# Patient Record
Sex: Female | Born: 1955 | Race: Black or African American | Hispanic: No | Marital: Single | Smoking: Current every day smoker
Health system: Southern US, Community
[De-identification: ages and names within clinical notes are randomized; demographics above are authoritative.]

## PROBLEM LIST (undated history)

## (undated) DIAGNOSIS — G510 Bell's palsy: Secondary | ICD-10-CM

## (undated) HISTORY — DX: Bell's palsy: G51.0

---

## 2000-10-09 ENCOUNTER — Emergency Department (HOSPITAL_COMMUNITY): Admission: EM | Admit: 2000-10-09 | Discharge: 2000-10-09 | Payer: Self-pay | Admitting: Emergency Medicine

## 2000-10-09 ENCOUNTER — Encounter: Payer: Self-pay | Admitting: Emergency Medicine

## 2017-06-15 ENCOUNTER — Emergency Department (HOSPITAL_COMMUNITY)
Admission: EM | Admit: 2017-06-15 | Discharge: 2017-06-15 | Disposition: A | Discharge status: Home / Self Care | Payer: Self-pay | Attending: Emergency Medicine | Admitting: Emergency Medicine

## 2017-06-15 ENCOUNTER — Encounter (HOSPITAL_COMMUNITY): Payer: Self-pay

## 2017-06-15 DIAGNOSIS — M25561 Pain in right knee: Secondary | ICD-10-CM | POA: Insufficient documentation

## 2017-06-15 DIAGNOSIS — Y998 Other external cause status: Secondary | ICD-10-CM | POA: Insufficient documentation

## 2017-06-15 DIAGNOSIS — M545 Low back pain: Secondary | ICD-10-CM | POA: Insufficient documentation

## 2017-06-15 DIAGNOSIS — Z5321 Procedure and treatment not carried out due to patient leaving prior to being seen by health care provider: Secondary | ICD-10-CM | POA: Insufficient documentation

## 2017-06-15 DIAGNOSIS — Y939 Activity, unspecified: Secondary | ICD-10-CM | POA: Insufficient documentation

## 2017-06-15 DIAGNOSIS — Y92410 Unspecified street and highway as the place of occurrence of the external cause: Secondary | ICD-10-CM | POA: Insufficient documentation

## 2017-06-15 DIAGNOSIS — M25562 Pain in left knee: Secondary | ICD-10-CM | POA: Insufficient documentation

## 2017-06-15 NOTE — ED Notes (Signed)
No answer x3
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
## 2017-06-19 NOTE — ED Provider Notes (Signed)
I did not see or evaluate the patient. Left without being seen after triage   Azalia Bilis, MD 06/19/17 [phone]

## 2020-08-26 ENCOUNTER — Emergency Department (HOSPITAL_COMMUNITY)
Admission: EM | Admit: 2020-08-26 | Discharge: 2020-08-27 | Discharge status: Against Medical Advice | Payer: Self-pay | Attending: Student | Admitting: Student

## 2020-08-26 ENCOUNTER — Encounter (HOSPITAL_COMMUNITY): Payer: Self-pay

## 2020-08-26 ENCOUNTER — Emergency Department (HOSPITAL_COMMUNITY): Payer: Self-pay

## 2020-08-26 DIAGNOSIS — R791 Abnormal coagulation profile: Secondary | ICD-10-CM | POA: Insufficient documentation

## 2020-08-26 DIAGNOSIS — Y900 Blood alcohol level of less than 20 mg/100 ml: Secondary | ICD-10-CM | POA: Insufficient documentation

## 2020-08-26 DIAGNOSIS — R29810 Facial weakness: Secondary | ICD-10-CM | POA: Insufficient documentation

## 2020-08-26 LAB — PROTIME-INR
INR: 1 (ref 0.8–1.2)
Prothrombin Time: 12.7 seconds (ref 11.4–15.2)

## 2020-08-26 LAB — DIFFERENTIAL
Abs Immature Granulocytes: 0.02 10*3/uL (ref 0.00–0.07)
Basophils Absolute: 0 10*3/uL (ref 0.0–0.1)
Basophils Relative: 1 %
Eosinophils Absolute: 0.1 10*3/uL (ref 0.0–0.5)
Eosinophils Relative: 2 %
Immature Granulocytes: 0 %
Lymphocytes Relative: 35 %
Lymphs Abs: 1.9 10*3/uL (ref 0.7–4.0)
Monocytes Absolute: 0.5 10*3/uL (ref 0.1–1.0)
Monocytes Relative: 10 %
Neutro Abs: 3 10*3/uL (ref 1.7–7.7)
Neutrophils Relative %: 52 %

## 2020-08-26 LAB — APTT: aPTT: 31 seconds (ref 24–36)

## 2020-08-26 LAB — I-STAT CHEM 8, ED
BUN: 13 mg/dL (ref 8–23)
Calcium, Ion: 1.05 mmol/L — ABNORMAL LOW (ref 1.15–1.40)
Chloride: 102 mmol/L (ref 98–111)
Creatinine, Ser: 0.6 mg/dL (ref 0.44–1.00)
Glucose, Bld: 139 mg/dL — ABNORMAL HIGH (ref 70–99)
HCT: 39 % (ref 36.0–46.0)
Hemoglobin: 13.3 g/dL (ref 12.0–15.0)
Potassium: 3.8 mmol/L (ref 3.5–5.1)
Sodium: 137 mmol/L (ref 135–145)
TCO2: 29 mmol/L (ref 22–32)

## 2020-08-26 LAB — COMPREHENSIVE METABOLIC PANEL
ALT: 24 U/L (ref 0–44)
AST: 24 U/L (ref 15–41)
Albumin: 3.7 g/dL (ref 3.5–5.0)
Alkaline Phosphatase: 56 U/L (ref 38–126)
Anion gap: 9 (ref 5–15)
BUN: 11 mg/dL (ref 8–23)
CO2: 28 mmol/L (ref 22–32)
Calcium: 9.7 mg/dL (ref 8.9–10.3)
Chloride: 100 mmol/L (ref 98–111)
Creatinine, Ser: 0.72 mg/dL (ref 0.44–1.00)
GFR, Estimated: >60 mL/min (ref >60)
Glucose, Bld: 137 mg/dL — ABNORMAL HIGH (ref 70–99)
Potassium: 3.7 mmol/L (ref 3.5–5.1)
Sodium: 137 mmol/L (ref 135–145)
Total Bilirubin: 0.6 mg/dL (ref 0.3–1.2)
Total Protein: 7.4 g/dL (ref 6.5–8.1)

## 2020-08-26 LAB — CBC
HCT: 41.4 % (ref 36.0–46.0)
Hemoglobin: 12.6 g/dL (ref 12.0–15.0)
MCH: 27.8 pg (ref 26.0–34.0)
MCHC: 30.4 g/dL (ref 30.0–36.0)
MCV: 91.4 fL (ref 80.0–100.0)
Platelets: 346 10*3/uL (ref 150–400)
RBC: 4.53 MIL/uL (ref 3.87–5.11)
RDW: 14.5 % (ref 11.5–15.5)
WBC: 5.6 10*3/uL (ref 4.0–10.5)
nRBC: 0 % (ref 0.0–0.2)

## 2020-08-26 LAB — ETHANOL: Alcohol, Ethyl (B): <10 mg/dL (ref <10)

## 2020-08-26 NOTE — ED Provider Notes (Signed)
Emergency Medicine Provider Triage Evaluation Note  Janet Miller , a 65 y.o. female  was evaluated in triage.  Pt complains of left-sided facial numbness.  Patient last known normal was last night.  No weakness of the body but there is some left-sided numbness.  She has no medical problems, takes no medicine.  No history of blood clots or previous stroke..  Review of Systems  Positive: Unable to close left eyelid, left facial numbness Negative: Extremity weakness   Physical Exam  BP (!) 175/107   Pulse 95   Temp 98.9 F (37.2 C) (Oral)   Resp 18   SpO2 100%  Gen:   Awake, tearful and anxious  Resp:  Normal effort  MSK:   Moves extremities without difficulty  Other:  Unable to raise left eye brow.  Grip strength in tact bilaterally.  Negative pronator drift.  Able to raise both legs.    Medical Decision Making  Medically screening exam initiated at 5:39 PM.  Appropriate orders placed.  Janet Miller was informed that the remainder of the evaluation will be completed by another provider, this initial triage assessment does not replace that evaluation, and the importance of remaining in the ED until their evaluation is complete.  Suspect bells palsy, outside of stroke window    Theron Arista, Cordelia Poche 08/26/20 1741    Dione Booze, MD 08/29/20 2237

## 2020-08-26 NOTE — ED Notes (Signed)
Pt left due to not being seen quick enough 

## 2020-08-26 NOTE — ED Triage Notes (Signed)
Pt arrives POV for eval of L sided facial droop and paralysis. Reports she was normal going to bed last night and awoke this AM w/ the facial weakness. L eyebrow/lid does not move on command. Forehead involvement as well

## 2020-12-15 ENCOUNTER — Encounter: Payer: Self-pay | Admitting: Neurology

## 2021-01-24 ENCOUNTER — Other Ambulatory Visit: Payer: Self-pay | Admitting: Family

## 2021-01-24 ENCOUNTER — Other Ambulatory Visit: Payer: Self-pay

## 2021-01-24 DIAGNOSIS — F17210 Nicotine dependence, cigarettes, uncomplicated: Secondary | ICD-10-CM

## 2021-03-14 NOTE — Progress Notes (Signed)
NEUROLOGY CONSULTATION NOTE  Janet Miller MRN: [id number] DOB: 1955/02/15  Referring provider: Hillery Aldo, NP Primary care provider: Hillery Aldo, NP  Reason for consult:  Bell's palsy  Assessment/Plan:   Left sided Bell's palsy - classic presentation and without other symptoms concerning for alternative diagnosis Family history of Moyamoya disease Vertigo - very well could be peripheral vertigo.  However, given her family history, would check vertebrobasilar system Hypertension  1   MRI of brain and MRA head and neck 2   Pending results of MR and if dizziness does not continue to improve, consider vestibular rehab 3  Further recommendations pending results. 4  Follow up with PCP regarding blood pressure   Subjective:  Janet Miller is a 66 year old left-handed female who presents for Bell's palsy.  History supplemented by ED and referring provider's note.  She woke up on the morning of 08/26/2020 with left sided upper and lower facial droop.  CT head personally reviewed was unremarkable.  Bell's palsy was suspected but patient left before anything more could be done due to the long wait time.  Afterwards, she was found to have a left inner ear infection.  Treated with prednisone and antibiotics.  Overall improved but still with mild facial symmetry.  May have some twitching.    Concerned about Moyamoya.  Her mother, maternal aunt and maternal grandmother had Moyamoya.    For the past week ,reports dizziness.  It is a spinning sensation.  It is a persistent mild dizziness.  Bending over to tie her shoes aggravates it for less than a minute.  No nausea or double vision unilateral numbness or weakness.        Labs from September include sed rate 51, CRP 21.9, negative ANA, negative RF, negative Lyme antibody, TSH 1.27, D 25-OH 59, normal CBC and unremarkable CMP   PAST MEDICAL HISTORY: Past Medical History:  Diagnosis Date   Bell's palsy      PAST SURGICAL  HISTORY: No past surgical history on file.   MEDICATIONS: No current outpatient medications on file prior to visit.   No current facility-administered medications on file prior to visit.      ALLERGIES: No Known Allergies  FAMILY HISTORY: Family History  Problem Relation Age of Onset   Dementia Mother    Seizures Mother    Stroke Mother    Moyamoya disease Mother    Moyamoya disease Maternal Aunt    Moyamoya disease Maternal Grandmother     Objective:  Blood pressure (!) 150/80, pulse 77, height 5\' 5"  (1.651 m), weight 176 lb 9.6 oz (80.1 kg), SpO2 99 %. General: No acute distress.  Patient appears well-groomed.   Head:  Normocephalic/atraumatic Eyes:  fundi examined but not visualized Neck: supple, no paraspinal tenderness, full range of motion Back: No paraspinal tenderness Heart: regular rate and rhythm Lungs: Clear to auscultation bilaterally. Vascular: No carotid bruits. Neurological Exam: Mental status: alert and oriented to person, place, and time, recent and remote memory intact, fund of knowledge intact, attention and concentration intact, speech fluent and not dysarthric, language intact. Cranial nerves: CN I: not tested CN II: pupils equal, round and reactive to light, visual fields intact CN III, IV, VI:  full range of motion.  Horizontal nystagmus noted on bilateral lateral and orthophoric gaze. CN V: facial sensation intact. CN VII: mild left upper and lower facial weakness CN VIII: hearing intact CN IX, X: gag intact, uvula midline CN XI: sternocleidomastoid and trapezius muscles intact  CN XII: tongue midline Bulk & Tone: normal, no fasciculations. Motor:  muscle strength 5/5 throughout Sensation:  Pinprick, temperature and vibratory sensation intact. Deep Tendon Reflexes:  2+ throughout,  toes downgoing.   Finger to nose testing:  Without dysmetria.   Heel to shin:  Without dysmetria.   Gait:  Steady station and gait.  Unsteady tandem walk.   Romberg with mild sway.    Thank you for allowing me to take part in the care of this patient.  Shon Millet, DO  CC:  Hillery Aldo, NP  Rozetta Nunnery, FNP

## 2021-03-15 ENCOUNTER — Encounter: Payer: Self-pay | Admitting: Neurology

## 2021-03-15 ENCOUNTER — Ambulatory Visit: Payer: Medicare Other | Admitting: Neurology

## 2021-03-15 ENCOUNTER — Other Ambulatory Visit: Payer: Self-pay

## 2021-03-15 VITALS — BP 150/80 | HR 77 | Ht 65.0 in | Wt 176.6 lb

## 2021-03-15 DIAGNOSIS — G510 Bell's palsy: Secondary | ICD-10-CM

## 2021-03-15 DIAGNOSIS — R42 Dizziness and giddiness: Secondary | ICD-10-CM | POA: Diagnosis not present

## 2021-03-15 DIAGNOSIS — H5500 Unspecified nystagmus: Secondary | ICD-10-CM | POA: Diagnosis not present

## 2021-03-15 DIAGNOSIS — Z8249 Family history of ischemic heart disease and other diseases of the circulatory system: Secondary | ICD-10-CM | POA: Diagnosis not present

## 2021-03-15 DIAGNOSIS — I10 Essential (primary) hypertension: Secondary | ICD-10-CM

## 2021-03-15 NOTE — Patient Instructions (Signed)
Check MRI of brain and MRA of head and neck Further recommendations pending results If dizziness does not continue to improve, we can refer to physical therapy for vestibular rehab.

## 2021-03-16 NOTE — Progress Notes (Signed)
NO PA required per [employer].   MRI/MRA head scheduled for 03/22/21 at 9 pt to arrive at 8:30at [facility] hospital.

## 2021-03-22 ENCOUNTER — Ambulatory Visit (HOSPITAL_COMMUNITY): Admission: RE | Admit: 2021-03-22 | Payer: Medicare Other | Source: Ambulatory Visit

## 2021-03-22 ENCOUNTER — Encounter (HOSPITAL_COMMUNITY): Payer: Self-pay

## 2021-03-22 ENCOUNTER — Ambulatory Visit (HOSPITAL_COMMUNITY): Payer: Medicare Other

## 2021-09-27 ENCOUNTER — Other Ambulatory Visit: Payer: Self-pay | Admitting: Student

## 2021-09-27 DIAGNOSIS — E2839 Other primary ovarian failure: Secondary | ICD-10-CM

## 2021-10-23 ENCOUNTER — Inpatient Hospital Stay: Admission: RE | Admit: 2021-10-23 | Payer: Medicare Other | Source: Ambulatory Visit

## 2021-11-07 ENCOUNTER — Other Ambulatory Visit: Payer: Medicare Other

## 2022-02-26 NOTE — Progress Notes (Signed)
Office Visit Note  Patient: Janet Miller             Date of Birth: [birth year]           MRN: [id number]             PCP: Raymon Mutton., FNP Referring: Raymon Mutton., FNP Visit Date: 03/12/2022 Occupation: @GUAROCC @  Subjective:  Positive ANA   History of Present Illness: Janet Miller is a 67 y.o. female in consultation per request of her PCP for the evaluation of positive ANA.  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  According the patient in 2022 she developed left-sided Bell's palsy.  She states due to lack of insurance she could not see her PCP until 2023.  She states at that time she had left ear infection which was treated with antibiotics and prednisone.  She had no recurrence of ear infection.  She gives history of dry eyes for the last couple of years.  She has been using TheraTears .  She denies any history of oral ulcers, nasal ulcers, malar rash, photosensitivity, Raynaud's phenomenon, lymphadenopathy or inflammatory arthritis.  She denies any history of joint pain.  She states she is ambidextrous.  She notices intermittent popping in her right elbow but no discomfort.  She has been experiencing dry eyes since then. There is no family history of autoimmune disease.  she is gravida 0.   Activities of Daily Living:  Patient reports morning stiffness for 0 minute.   Patient Denies nocturnal pain.  Difficulty dressing/grooming: Denies Difficulty climbing stairs: Denies Difficulty getting out of chair: Denies Difficulty using hands for taps, buttons, cutlery, and/or writing: Denies  Review of Systems  Constitutional:  Negative for fatigue.  HENT:  Negative for mouth sores and mouth dryness.   Eyes:  Positive for dryness.  Respiratory:  Negative for shortness of breath.   Cardiovascular:  Negative for chest pain and palpitations.  Gastrointestinal:  Negative for blood in stool and constipation.  Endocrine: Negative for increased urination.   Genitourinary:  Positive for involuntary urination.  Musculoskeletal:  Negative for joint pain, gait problem, joint pain, joint swelling, myalgias, muscle weakness, morning stiffness, muscle tenderness and myalgias.  Skin:  Positive for hair loss. Negative for color change, rash and sensitivity to sunlight.  Allergic/Immunologic: Negative for susceptible to infections.  Neurological:  Negative for dizziness and headaches.  Hematological:  Negative for swollen glands.  Psychiatric/Behavioral:  Negative for depressed mood and sleep disturbance. The patient is nervous/anxious.     PMFS History:  Patient Active Problem List   Diagnosis Date Noted   History of atherosclerosis 03/12/2022   History of hyperlipidemia 03/12/2022    Past Medical History:  Diagnosis Date   Bell's palsy     Family History  Problem Relation Age of Onset   Dementia Mother    Seizures Mother    Stroke Mother    Moyamoya disease Mother    Diabetes Sister    Stroke Brother    Diabetes Brother    Moyamoya disease Maternal Grandmother    Moyamoya disease Maternal Aunt    History reviewed. No pertinent surgical history. Social History   Social History Narrative   Left handed    There is no immunization history on file for this patient.   Objective: Vital Signs: BP (!) 150/73 (BP Location: Left Arm, Patient Position: Sitting, Cuff Size: Small)   Pulse 67   Resp 12   Ht 5'  5.5" (1.664 m)   Wt 178 lb (80.7 kg)   BMI 29.17 kg/m    Physical Exam Vitals and nursing note reviewed.  Constitutional:      Appearance: She is well-developed.  HENT:     Head: Normocephalic and atraumatic.  Eyes:     Conjunctiva/sclera: Conjunctivae normal.  Cardiovascular:     Rate and Rhythm: Normal rate and regular rhythm.     Heart sounds: Normal heart sounds.  Pulmonary:     Effort: Pulmonary effort is normal.     Breath sounds: Normal breath sounds.  Abdominal:     General: Bowel sounds are normal.      Palpations: Abdomen is soft.  Musculoskeletal:     Cervical back: Normal range of motion.  Lymphadenopathy:     Cervical: No cervical adenopathy.  Skin:    General: Skin is warm and dry.     Capillary Refill: Capillary refill takes less than 2 seconds.  Neurological:     Mental Status: She is alert and oriented to person, place, and time.     Comments: Left-sided Bell's palsy  Psychiatric:        Behavior: Behavior normal.      Musculoskeletal Exam: Cervical, thoracic and lumbar spine were in good range of motion.  She had no tenderness over thoracic or lumbar spine or SI joints.  Shoulder joints, elbow joints, wrist joints, MCPs PIPs and DIPs been good range of motion with no synovitis.  She had thickening of the right first PIP joint.  Hip joints and knee joints were in good range of motion without any warmth swelling or effusion.  There was no tenderness over ankles or MTPs.  CDAI Exam: CDAI Score: -- Patient Global: --; Provider Global: -- Swollen: --; Tender: -- Joint Exam 03/12/2022   No joint exam has been documented for this visit   There is currently no information documented on the homunculus. Go to the Rheumatology activity and complete the homunculus joint exam.  Investigation: No additional findings.  Imaging: No results found.  Recent Labs: Lab Results  Component Value Date   WBC 5.6 08/26/2020   HGB 13.3 08/26/2020   PLT 346 08/26/2020   NA 137 08/26/2020   K 3.8 08/26/2020   CL 102 08/26/2020   CO2 28 08/26/2020   GLUCOSE 139 (H) 08/26/2020   BUN 13 08/26/2020   CREATININE 0.60 08/26/2020   BILITOT 0.6 08/26/2020   ALKPHOS 56 08/26/2020   AST 24 08/26/2020   ALT 24 08/26/2020   PROT 7.4 08/26/2020   ALBUMIN 3.7 08/26/2020   CALCIUM 9.7 08/26/2020   Jun 19, 2021 CBC and CMP normal Speciality Comments: No specialty comments available.  Procedures:  No procedures performed Allergies: Statins   Assessment / Plan:     Visit Diagnoses: Positive  ANA (antinuclear antibody) - 06/19/21:ANA 1:80NS, RF 14, ESR 34, CRP 6.1, Hep C negative, TSH 1.41, BNP 29, PTH 14 -she has low titer positive ANA.  She gives history of dry eyes.    There is no history of oral ulcers, nasal ulcers, malar rash, photosensitivity, Raynaud's, inflammatory arthritis, lymphadenopathy or photosensitivity.  I will order additional labs today.  Plan: C3 and C4, RNP Antibody, Anti-Smith antibody, Sjogrens syndrome-A extractable nuclear antibody, Sjogrens syndrome-B extractable nuclear antibody, Anti-DNA antibody, double-stranded  Dry eyes-she gives history of dry eyes.  She has been using TheraTears.  I will obtain Sjogren's antibodies.  Over-the-counter products were discussed.  A handout was placed in the AVS.  Vitamin D deficiency-history of vitamin D deficiency.  She has been taking calcium with vitamin D.  Elevated hemoglobin A1c-she is on metformin.  History of hyperlipidemia-she is on Zetia 10 mg p.o. daily.  History of atherosclerosis - Bilateral LE  Smoker - 1 PPD x 51 years.  Orders: Orders Placed This Encounter  Procedures   C3 and C4   RNP Antibody   Anti-Smith antibody   Sjogrens syndrome-A extractable nuclear antibody   Sjogrens syndrome-B extractable nuclear antibody   Anti-DNA antibody, double-stranded   No orders of the defined types were placed in this encounter.    Follow-Up Instructions: Return for +ANA.   Bo Merino, MD  Note - This record has been created using Editor, commissioning.  Chart creation errors have been sought, but may not always  have been located. Such creation errors do not reflect on  the standard of medical care.,

## 2022-03-12 ENCOUNTER — Encounter: Payer: Self-pay | Admitting: Rheumatology

## 2022-03-12 ENCOUNTER — Ambulatory Visit: Payer: Medicare Other | Attending: Rheumatology | Admitting: Rheumatology

## 2022-03-12 VITALS — BP 145/78 | HR 65 | Resp 12 | Ht 65.5 in | Wt 178.0 lb

## 2022-03-12 DIAGNOSIS — R7309 Other abnormal glucose: Secondary | ICD-10-CM | POA: Diagnosis not present

## 2022-03-12 DIAGNOSIS — F17200 Nicotine dependence, unspecified, uncomplicated: Secondary | ICD-10-CM

## 2022-03-12 DIAGNOSIS — R768 Other specified abnormal immunological findings in serum: Secondary | ICD-10-CM | POA: Diagnosis not present

## 2022-03-12 DIAGNOSIS — E559 Vitamin D deficiency, unspecified: Secondary | ICD-10-CM

## 2022-03-12 DIAGNOSIS — H04123 Dry eye syndrome of bilateral lacrimal glands: Secondary | ICD-10-CM | POA: Diagnosis not present

## 2022-03-12 DIAGNOSIS — Z8679 Personal history of other diseases of the circulatory system: Secondary | ICD-10-CM

## 2022-03-12 DIAGNOSIS — Z8639 Personal history of other endocrine, nutritional and metabolic disease: Secondary | ICD-10-CM

## 2022-03-12 NOTE — Patient Instructions (Signed)
Dry Eye  Dry eye, also called keratoconjunctivitis sicca, is a condition caused by dryness of the membranes surrounding the eye. It happens when there are not enough healthy, natural tears in the eyes. The eyes must remain moist at all times for good comfort and vision. A small amount of tears is constantly produced by the tear glands (lacrimal glands). These glands are mainly located under the outside part of the upper eyelids. The eyelids produce oils that coat the tears to keep them from evaporating quickly. If the eyelids are inflamed (blepharitis), the lack of healthy oils can make the dry eye worse. Dry eye can happen on its own or be a symptom of several conditions, such as rheumatoid arthritis, lupus, or Sjgren's syndrome. Dry eye may be mild to severe. What are the causes? This condition may be caused by: Not making enough tears (aqueous tear-deficient dry eyes). Tears evaporating from the eyes too quickly (evaporative dry eyes). This is when there is an abnormality in the quality of your tears, especially the oils. This abnormality causes your tears to evaporate so quickly that the eyes cannot be kept moist. What increases the risk? You are more likely to develop this condition if you: Are a woman, especially if you have gone through menopause. Are older. Live in a dry climate. Live or work in a dusty or smoky area. Take certain medicines, such as: Anti-allergy medicines (antihistamines). Blood pressure medicines (antihypertensives), especially "water pills" (diuretics). Birth control pills (oral contraceptives). Laxatives. Tranquilizers. Have eyelid inflammation (blepharitis). Have a history of refractive eye surgery, such as LASIK. Have a history of long-term contact lens use. What are the signs or symptoms? Symptoms of this condition include: Irritation. You may feel: Itchiness. Burning. A feeling as though something is stuck in the eye. Redness. Inflammation of the  eyelids. Light sensitivity. Increased sensitivity and discomfort when wearing contact lenses. Vision that varies throughout the day. Occasional excessive tearing. How is this diagnosed? This condition is diagnosed based on your symptoms, your medical history, and an eye exam. Your health care provider may look at your eye using a microscope and may put dyes in your eye to check the health of the surface of your eye. You may have tests, such as a test to evaluate your tear production (Schirmer test). During this test: A small strip of special paper is gently pressed partly under your lower eyelid. Your tear production is measured by how much of the paper is moistened by your tears during a set amount of time. You may be referred to a health care provider who specializes in medical and surgical eye care (ophthalmologist). How is this treated? Treatment for this condition depends on the type and severity of the dry eyes. To help relieve your symptoms, your health care provider may recommend over-the-counter artificial tears. Artificial tears either come in bottles that have mild preservatives or in small vials or bottles without preservatives. Patients with mild dry eye may do well with tears that have preservatives, while those with more severe dry eye should just use tears without preservatives. Note that one vial may be used several times a day, but should be discarded at the end of the day. If your condition is severe, treatment may also include: Prescription eye drops. Over-the-counter or prescription gels or ointments to moisten your eyes. A prescription nasal spray that increases tear production. Minor surgery to place plugs into the tear drainage ducts. This keep tears from exiting the eye so that tears can stay  on the surface of the eye longer. Medicines to reduce inflammation of the eyelids. Taking an omega-3 fatty acid nutritional supplement. Other treatments include making tears from  your own blood (autologous serum tears), wearing special contact lenses, and even having minor surgery to partially close the outer parts of your eyelids to decrease evaporation. Follow these instructions at home: Take or apply over-the-counter and prescription medicines only as told by your health care provider. This includes eye drops. If directed, apply a warm compress to your eyes to help reduce eyelid inflammation. Place a towel over your eyes and gently press the warm compress over your eyes for about 5 minutes, or as long as told by your health care provider. Drink plenty of fluids to stay well hydrated. If possible, avoid dry, drafty environments. Wear sunglasses when outdoors to protect your eyes from the sun and wind. Use a humidifier at home to increase moisture in the air. Remember to blink often when reading or using the computer for long periods. If you wear contact lenses, remove them regularly to give your eyes a break. Always remove your contact lenses before sleeping. Have a yearly eye exam and vision test. Keep all follow-up visits. This is important. Contact a health care provider if: You have eye pain. You have pus-like fluid coming from your eye. Your symptoms get worse or do not improve with treatment. Get help right away if: Your vision suddenly changes. Summary Dry eye is dryness of the membranes surrounding the eye. Dry eye can happen on its own or be a symptom of several conditions, such as rheumatoid arthritis, lupus, or Sjgren's syndrome. This condition is diagnosed based on your symptoms, your medical history, and an eye exam. Treatment for this condition depends on the type and severity of the dry eye. To help relieve your symptoms, your health care provider may recommend over-the-counter artificial tears. This information is not intended to replace advice given to you by your health care provider. Make sure you discuss any questions you have with your health  care provider. Document Revised: 06/14/2020 Document Reviewed: 06/14/2020 Elsevier Patient Education  [place].

## 2022-03-13 LAB — ANTI-SMITH ANTIBODY: ENA SM Ab Ser-aCnc: <1 AI

## 2022-03-13 LAB — SJOGRENS SYNDROME-A EXTRACTABLE NUCLEAR ANTIBODY: SSA (Ro) (ENA) Antibody, IgG: <1 AI

## 2022-03-13 LAB — C3 AND C4
C3 Complement: 135 mg/dL (ref 83–193)
C4 Complement: 35 mg/dL (ref 15–57)

## 2022-03-13 LAB — RNP ANTIBODY: Ribonucleic Protein(ENA) Antibody, IgG: <1 AI

## 2022-03-13 LAB — SJOGRENS SYNDROME-B EXTRACTABLE NUCLEAR ANTIBODY: SSB (La) (ENA) Antibody, IgG: <1 AI

## 2022-03-13 LAB — ANTI-DNA ANTIBODY, DOUBLE-STRANDED: ds DNA Ab: 1 IU/mL

## 2022-03-14 NOTE — Progress Notes (Signed)
All autoimmune labs are normal.  I will discuss results at the follow-up visit.

## 2022-03-20 NOTE — Progress Notes (Addendum)
Office Visit Note  Patient: Janet Miller             Date of Birth: 06-21-1955           MRN: [id number]             PCP: Sonia Side., FNP Referring: Sonia Side., FNP Visit Date: 04/03/2022 Occupation: '@GUAROCC'$ @  Subjective:  Dry eyes  History of Present Illness: Janet Miller is a 67 y.o. female with history of positive ANA and dry eyes.  She returns for the follow-up visit.  She states she continues to have dry eyes.  She denies any history of dry mouth.  She uses TheraTears which are not very effective.  She denies any history of oral ulcers, nasal ulcers, malar rash, photosensitivity, Raynaud's, inflammatory arthritis or lymphadenopathy.  She denies taking any over-the-counter products which could be causing dry eyes.  She is a smoker and still smokes 1 pack/day.    Activities of Daily Living:  Patient reports morning stiffness for 0 minutes.   Patient Denies nocturnal pain.  Difficulty dressing/grooming: Denies Difficulty climbing stairs: Reports Difficulty getting out of chair: Reports Difficulty using hands for taps, buttons, cutlery, and/or writing: Denies  Review of Systems  Constitutional: Negative.  Negative for fatigue.  HENT: Negative.  Negative for mouth sores and mouth dryness.   Eyes:  Positive for dryness.  Respiratory: Negative.  Negative for shortness of breath.   Cardiovascular: Negative.  Negative for chest pain and palpitations.  Gastrointestinal: Negative.  Negative for blood in stool, constipation and diarrhea.  Endocrine: Negative.  Negative for increased urination.  Genitourinary:  Positive for involuntary urination.  Musculoskeletal:  Negative for joint pain, gait problem, joint pain, joint swelling, myalgias, muscle weakness, morning stiffness, muscle tenderness and myalgias.  Skin: Negative.  Negative for color change, rash, hair loss and sensitivity to sunlight.  Allergic/Immunologic: Negative.  Negative for susceptible to  infections.  Neurological: Negative.  Negative for dizziness and headaches.  Hematological: Negative.  Negative for swollen glands.  Psychiatric/Behavioral: Negative.  Negative for depressed mood and sleep disturbance. The patient is not nervous/anxious.     PMFS History:  Patient Active Problem List   Diagnosis Date Noted   History of atherosclerosis 03/12/2022   History of hyperlipidemia 03/12/2022    Past Medical History:  Diagnosis Date   Bell's palsy     Family History  Problem Relation Age of Onset   Dementia Mother    Seizures Mother    Stroke Mother    Moyamoya disease Mother    Diabetes Sister    Stroke Brother    Diabetes Brother    Moyamoya disease Maternal Grandmother    Moyamoya disease Maternal Aunt    History reviewed. No pertinent surgical history. Social History   Social History Narrative   Left handed    There is no immunization history on file for this patient.   Objective: Vital Signs: BP (!) 146/79 (BP Location: Left Arm, Patient Position: Sitting, Cuff Size: Normal)   Pulse 71   Resp 16   Ht 5' 5.5" (1.664 m)   Wt 176 lb 12.8 oz (80.2 kg)   BMI 28.97 kg/m    Physical Exam Vitals and nursing note reviewed.  Constitutional:      Appearance: She is well-developed.  HENT:     Head: Normocephalic and atraumatic.  Eyes:     Conjunctiva/sclera: Conjunctivae normal.  Cardiovascular:     Rate and Rhythm: Normal rate  and regular rhythm.     Heart sounds: Normal heart sounds.  Pulmonary:     Effort: Pulmonary effort is normal.     Breath sounds: Normal breath sounds.  Abdominal:     General: Bowel sounds are normal.     Palpations: Abdomen is soft.  Musculoskeletal:     Cervical back: Normal range of motion.  Lymphadenopathy:     Cervical: No cervical adenopathy.  Skin:    General: Skin is warm and dry.     Capillary Refill: Capillary refill takes less than 2 seconds.  Neurological:     Mental Status: She is alert and oriented to  person, place, and time.     Comments: Left-sided Bell's palsy  Psychiatric:        Behavior: Behavior normal.      Musculoskeletal Exam: Cervical, thoracic and lumbar spine were in good range of motion.  Shoulder joints, elbow joints, wrist joints, MCPs PIPs and DIPs were in good range of motion with no synovitis.  Hip joints and knee joints in good range of motion with no synovitis.  There was no tenderness over ankles or MTPs.  CDAI Exam: CDAI Score: -- Patient Global: --; Provider Global: -- Swollen: --; Tender: -- Joint Exam 04/03/2022   No joint exam has been documented for this visit   There is currently no information documented on the homunculus. Go to the Rheumatology activity and complete the homunculus joint exam.  Investigation: No additional findings.  Imaging: No results found.  Recent Labs: Lab Results  Component Value Date   WBC 5.6 08/26/2020   HGB 13.3 08/26/2020   PLT 346 08/26/2020   NA 137 08/26/2020   K 3.8 08/26/2020   CL 102 08/26/2020   CO2 28 08/26/2020   GLUCOSE 139 (H) 08/26/2020   BUN 13 08/26/2020   CREATININE 0.60 08/26/2020   BILITOT 0.6 08/26/2020   ALKPHOS 56 08/26/2020   AST 24 08/26/2020   ALT 24 08/26/2020   PROT 7.4 08/26/2020   ALBUMIN 3.7 08/26/2020   CALCIUM 9.7 08/26/2020      06/19/21:ANA 1:80NS, RF 14, ESR 34, CRP 6.1, Hep C negative, TSH 1.41, BNP 29, PTH 14   Speciality Comments: No specialty comments available.  Procedures:  No procedures performed Allergies: Statins   Assessment / Plan:     Visit Diagnoses: Positive ANA (antinuclear antibody) - Titer positive. March 12, 2022 ENA (dsDNA, SSA, [place], Smith, RNP) negative, C3-C4 normal.  Lab results were discussed with the patient.  ANA was low titer and not significant.  She continues to have dry eyes..  She denies any history of dry mouth.  There is no history of oral ulcers, nasal ulcers, malar rash, photosensitivity, Raynaud's, lymphadenopathy or inflammatory  arthritis.  Detailed counsel regarding dry eyes was provided.  She has been using TheraTears which are not very effective.  Use of over-the-counter eyedrops were discussed.  I also discussed an appointment with the ophthalmologist.  She is not taking any medications which can be causing dry eyes.  Although she is a smoker.  Smoking cessation was discussed at length.  I discussed the option of using pilocarpine.  I would like for her to be evaluated by an ophthalmologist before giving her pilocarpine.  Advised patient to contact us when she is seen the ophthalmologist.  I plan to start her on pilocarpine 5 mg p.o. 3 times daily as needed.  She may increase it to 3 times daily as tolerated.  Dry eyes -  She uses TheraTears.  SSA negative, SSB negative.  Use of over-the-counter products and humidifier was discussed.  Patient will contact us once she has seen her ophthalmologist.  Vitamin D deficiency-she takes vitamin D supplement.  H/O Bell's palsy-she has mild left-sided Bell's palsy.  Elevated blood pressure reading-patient blood pressure was elevated.  Blood pressure was repeated.  Patient was advised to monitor blood pressure closely and follow-up with the PCP.  History of hyperlipidemia-she takes.  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  Smoking cessation was discussed at length.  Smoking can also contribute to dry eyes.  A handout was given.  Orders: No orders of the defined types were placed in this encounter.  No orders of the defined types were placed in this encounter.    Follow-Up Instructions: Return in about 4 months (around 08/02/2022) for Dry eyes.   Bo Merino, MD  Note - This record has been created using Editor, commissioning.  Chart creation errors have been sought, but may not always  have been located. Such creation errors do not reflect on  the standard of medical care.

## 2022-04-03 ENCOUNTER — Encounter: Payer: Self-pay | Admitting: Rheumatology

## 2022-04-03 ENCOUNTER — Ambulatory Visit: Payer: Medicare Other | Attending: Rheumatology | Admitting: Rheumatology

## 2022-04-03 VITALS — BP 146/79 | HR 71 | Resp 16 | Ht 65.5 in | Wt 176.8 lb

## 2022-04-03 DIAGNOSIS — R768 Other specified abnormal immunological findings in serum: Secondary | ICD-10-CM | POA: Diagnosis not present

## 2022-04-03 DIAGNOSIS — Z8669 Personal history of other diseases of the nervous system and sense organs: Secondary | ICD-10-CM | POA: Diagnosis not present

## 2022-04-03 DIAGNOSIS — E559 Vitamin D deficiency, unspecified: Secondary | ICD-10-CM

## 2022-04-03 DIAGNOSIS — H04123 Dry eye syndrome of bilateral lacrimal glands: Secondary | ICD-10-CM

## 2022-04-03 DIAGNOSIS — Z8679 Personal history of other diseases of the circulatory system: Secondary | ICD-10-CM

## 2022-04-03 DIAGNOSIS — R7309 Other abnormal glucose: Secondary | ICD-10-CM

## 2022-04-03 DIAGNOSIS — Z8639 Personal history of other endocrine, nutritional and metabolic disease: Secondary | ICD-10-CM

## 2022-04-03 DIAGNOSIS — F17200 Nicotine dependence, unspecified, uncomplicated: Secondary | ICD-10-CM

## 2022-04-03 DIAGNOSIS — R030 Elevated blood-pressure reading, without diagnosis of hypertension: Secondary | ICD-10-CM

## 2022-04-03 NOTE — Patient Instructions (Addendum)
Pilocarpine Tablets What is this medication? PILOCARPINE (PYE loe KAR peen) treats dry mouth. It works by increasing the amount of saliva in the mouth, which makes it easier to speak and swallow. This medicine may be used for other purposes; ask your health care provider or pharmacist if you have questions. COMMON BRAND NAME(S): Salagen What should I tell my care team before I take this medication? They need to know if you have any of these conditions: Eye infection or other eye problems Glaucoma Heart disease Liver disease Lung or breathing disease, such as asthma An unusual or allergic reaction to pilocarpine, other medications, foods, dyes, or preservatives Pregnant or trying to get pregnant Breast-feeding How should I use this medication? Take this medication by mouth with a full glass of water. Take it as directed on the prescription label at the same time every day. Keep taking it unless your care team tells you to stop. Talk to your care team about the use of this medication in children. Special care may be needed. Overdosage: If you think you have taken too much of this medicine contact a poison control center or emergency room at once. NOTE: This medicine is only for you. Do not share this medicine with others. What if I miss a dose? If you miss a dose, take it as soon as you can. If it is almost time for your next dose, take only that dose. Do not take double or extra doses. What may interact with this medication? Antihistamines for allergy, cough, and cold Atropine Certain medications for Alzheimer disease, such as donepezil, galantamine, rivastigmine Certain medications for bladder problems, such as bethanechol, oxybutynin, tolterodine Certain medications for Parkinson disease, such as benztropine, trihexyphenidyl Certain medications for quitting smoking, such as nicotine Certain medications for stomach problems, such as dicyclomine, hyoscyamine Certain medications for  travel sickness, such as scopolamine Ipratropium Medications for blood pressure or heart problems, such as metoprolol This list may not describe all possible interactions. Give your health care provider a list of all the medicines, herbs, non-prescription drugs, or dietary supplements you use. Also tell them if you smoke, drink alcohol, or use illegal drugs. Some items may interact with your medicine. What should I watch for while using this medication? Visit your care team for regular checks on your progress. Tell your care team if your symptoms do not get better or if they get worse. You may get blurry vision or have trouble telling how far something is from you. This may be a problem at night or when the lights are low. Do not drive, use machinery, or do anything that needs clear vision until you know how this medication affects you. If you sweat a lot, drink enough to replace fluids. Do not get dehydrated. What side effects may I notice from receiving this medication? Side effects that you should report to your care team as soon as possible: Allergic reactions--skin rash, itching, hives, swelling of the face, lips, tongue, or throat Fast or irregular heartbeat Increase in blood pressure Low blood pressure--dizziness, feeling faint or lightheaded, blurry vision Slow heartbeat--dizziness, feeling faint or lightheaded, confusion, trouble breathing, unusual weakness or fatigue Side effects that usually do not require medical attention (report to your care team if they continue or are bothersome): Change in vision Chills Diarrhea Excessive sweating Flushing Headache Increased need to urinate This list may not describe all possible side effects. Call your doctor for medical advice about side effects. You may report side effects to FDA at 1-800-FDA-1088.  Where should I keep my medication? Keep out of the reach of children and pets. Store at room temperature between 15 and 30 degrees C (59 and  86 degrees F). Get rid of any unused medication after the expiration date. To get rid of medications that are no longer needed or have expired: Take the medications to a medication take-back program. Check with your pharmacy or law enforcement to find a location. If your cannot return the medication, check the label or package insert to see if the medication should be thrown out in the garbage or flushed down the toilet. If you are not sure, ask your care team. If it is safe to put it in the trash, take the medication out of the container. Mix the medication with cat litter, dirt, coffee grounds, or other unwanted substance. Seal the mixture in a bag or container. Put it in the trash. NOTE: This sheet is a summary. It may not cover all possible information. If you have questions about this medicine, talk to your doctor, pharmacist, or health care provider.  2023 Elsevier/Gold Standard (2021-01-06 00:00:00)   Managing the Challenge of Quitting Smoking Quitting smoking is a physical and mental challenge. You may have cravings, withdrawal symptoms, and temptation to smoke. Before quitting, work with your health care provider to make a plan that can help you manage quitting. Making a plan before you quit may keep you from smoking when you have the urge to smoke while trying to quit. How to manage lifestyle changes Managing stress Stress can make you want to smoke, and wanting to smoke may cause stress. It is important to find ways to manage your stress. You could try some of the following: Practice relaxation techniques. Breathe slowly and deeply, in through your nose and out through your mouth. Listen to music. Soak in a bath or take a shower. Imagine a peaceful place or vacation. Get some support. Talk with family or friends about your stress. Join a support group. Talk with a counselor or therapist. Get some physical activity. Go for a walk, run, or bike ride. Play a favorite sport. Practice  yoga.  Medicines Talk with your health care provider about medicines that might help you deal with cravings and make quitting easier for you. Relationships Social situations can be difficult when you are quitting smoking. To manage this, you can: Avoid parties and other social situations where people might be smoking. Avoid alcohol. Leave right away if you have the urge to smoke. Explain to your family and friends that you are quitting smoking. Ask for support and let them know you might be a bit grumpy. Plan activities where smoking is not an option. General instructions Be aware that many people gain weight after they quit smoking. However, not everyone does. To keep from gaining weight, have a plan in place before you quit, and stick to the plan after you quit. Your plan should include: Eating healthy snacks. When you have a craving, it may help to: Eat popcorn, or try carrots, celery, or other cut vegetables. Chew sugar-free gum. Changing how you eat. Eat small portion sizes at meals. Eat 4-6 small meals throughout the day instead of 1-2 large meals a day. Be mindful when you eat. You should avoid watching television or doing other things that might distract you as you eat. Exercising regularly. Make time to exercise each day. If you do not have time for a long workout, do short bouts of exercise for 5-10 minutes several times  a day. Do some form of strengthening exercise, such as weight lifting. Do some exercise that gets your heart beating and causes you to breathe deeply, such as walking fast, running, swimming, or biking. This is very important. Drinking plenty of water or other low-calorie or no-calorie drinks. Drink enough fluid to keep your urine pale yellow.  How to recognize withdrawal symptoms Your body and mind may experience discomfort as you try to get used to not having nicotine in your system. These effects are called withdrawal symptoms. They may include: Feeling  hungrier than normal. Having trouble concentrating. Feeling irritable or restless. Having trouble sleeping. Feeling depressed. Craving a cigarette. These symptoms may surprise you, but they are normal to have when quitting smoking. To manage withdrawal symptoms: Avoid places, people, and activities that trigger your cravings. Remember why you want to quit. Get plenty of sleep. Avoid coffee and other drinks that contain caffeine. These may worsen some of your symptoms. How to manage cravings Come up with a plan for how to deal with your cravings. The plan should include the following: A definition of the specific situation you want to deal with. An activity or action you will take to replace smoking. A clear idea for how this action will help. The name of someone who could help you with this. Cravings usually last for 5-10 minutes. Consider taking the following actions to help you with your plan to deal with cravings: Keep your mouth busy. Chew sugar-free gum. Suck on hard candies or a straw. Brush your teeth. Keep your hands and body busy. Change to a different activity right away. Squeeze or play with a ball. Do an activity or a hobby, such as making bead jewelry, practicing needlepoint, or working with wood. Mix up your normal routine. Take a short exercise break. Go for a quick walk, or run up and down stairs. Focus on doing something kind or helpful for someone else. Call a friend or family member to talk during a craving. Join a support group. Contact a quitline. Where to find support To get help or find a support group: Call the [facility]'s Smoking Quitline: 1-800-QUIT-NOW (803)111-9708) Text QUIT to SmokefreeTXTAZ:4618977 Where to find more information Visit these websites to find more information on quitting smoking: U.S. Department of Health and Human Services: www.smokefree.gov American Lung Association: www.freedomfromsmoking.org Centers for Disease  Control and Prevention (CDC): http://www.wolf.info/ American Heart Association: www.heart.org Contact a health care provider if: You want to change your plan for quitting. The medicines you are taking are not helping. Your eating feels out of control or you cannot sleep. You feel depressed or become very anxious. Summary Quitting smoking is a physical and mental challenge. You will face cravings, withdrawal symptoms, and temptation to smoke again. Preparation can help you as you go through these challenges. Try different techniques to manage stress, handle social situations, and prevent weight gain. You can deal with cravings by keeping your mouth busy (such as by chewing gum), keeping your hands and body busy, calling family or friends, or contacting a quitline for people who want to quit smoking. You can deal with withdrawal symptoms by avoiding places where people smoke, getting plenty of rest, and avoiding drinks that contain caffeine. This information is not intended to replace advice given to you by your health care provider. Make sure you discuss any questions you have with your health care provider. Document Revised: 01/13/2021 Document Reviewed: 01/13/2021 Elsevier Patient Education  [place].

## 2022-07-03 IMAGING — CT CT HEAD W/O CM
3 series · 14 of 47 positions shown, 16 images · non-contrast
Comparison: None.

CLINICAL DATA: TIA, left facial droop and paralysis

EXAM:
CT HEAD WITHOUT CONTRAST
TECHNIQUE: Contiguous axial images were obtained from the base of the skull
through the vertex without intravenous contrast.

[Series 4: head 5.0 h30s · axial · 0.42mm/px · z∈[+904,+1044]mm · 8 of 34 slices shown, 10 images]
[im 3/34  brain]
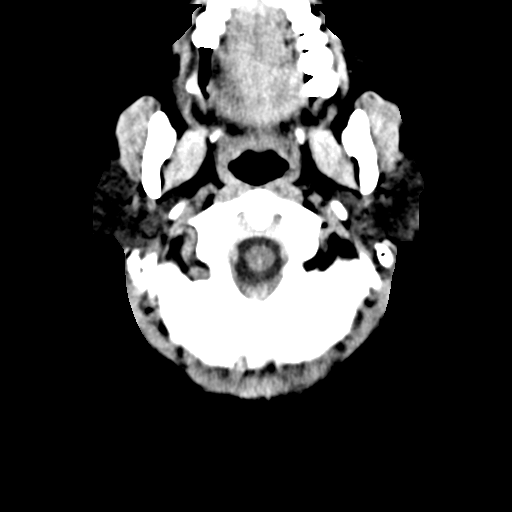
[im 3/34  bone]
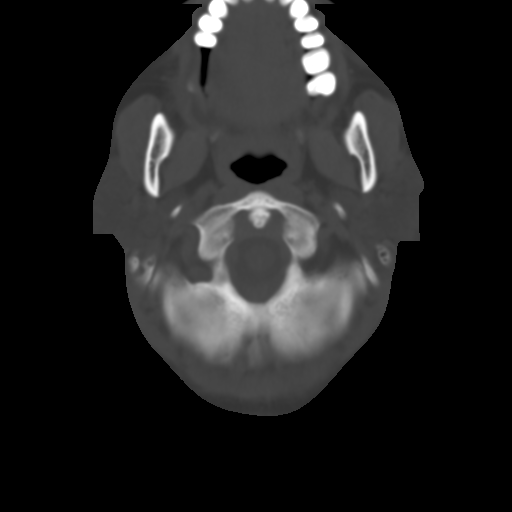
[im 7/34  brain]
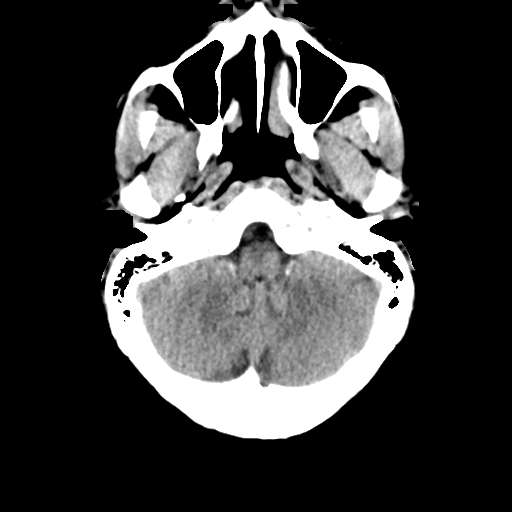
[im 11/34  brain]
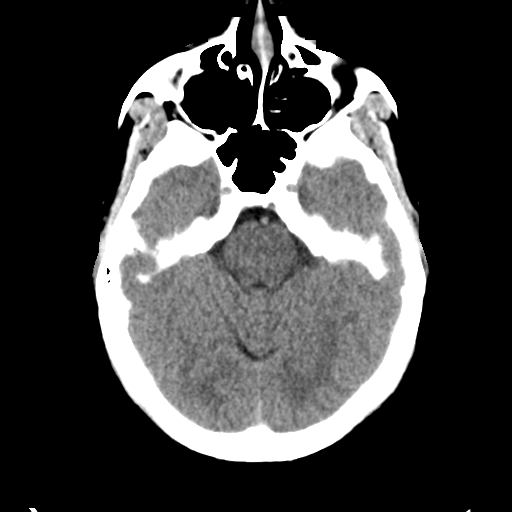
[im 15/34  brain]
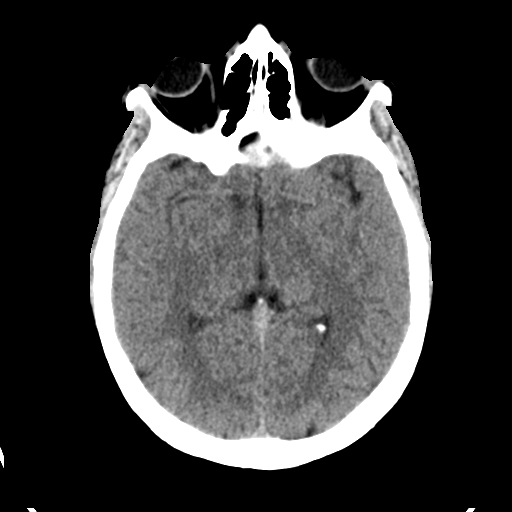
[im 19/34  brain]
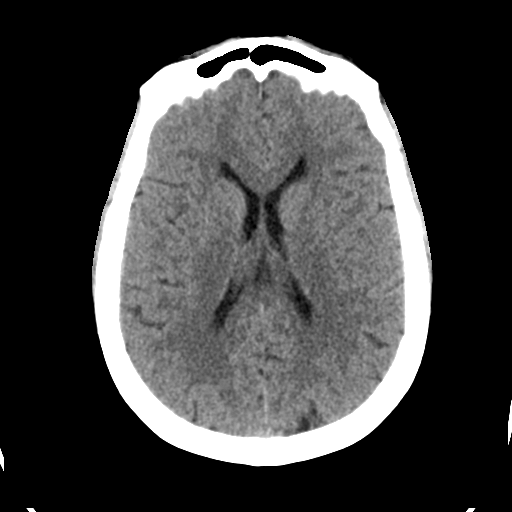
[im 19/34  bone]
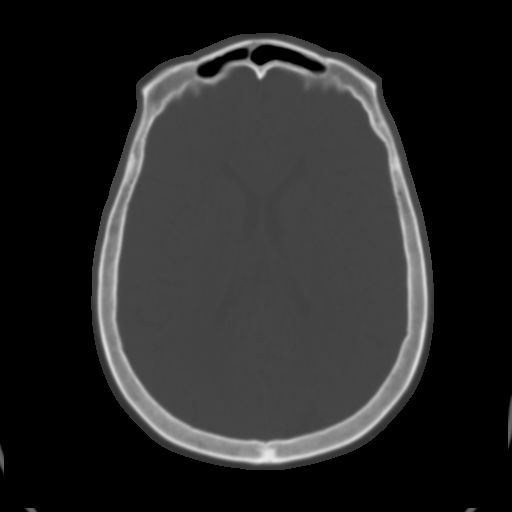
[im 23/34  brain]
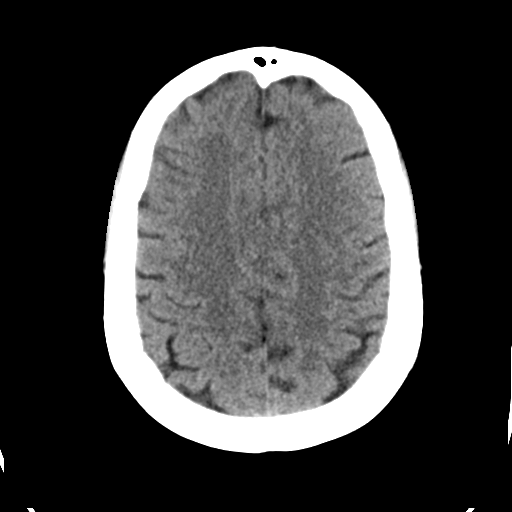
[im 27/34  brain]
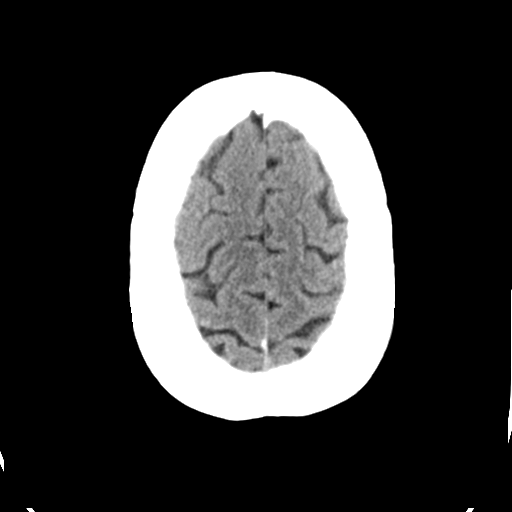
[im 31/34  brain]
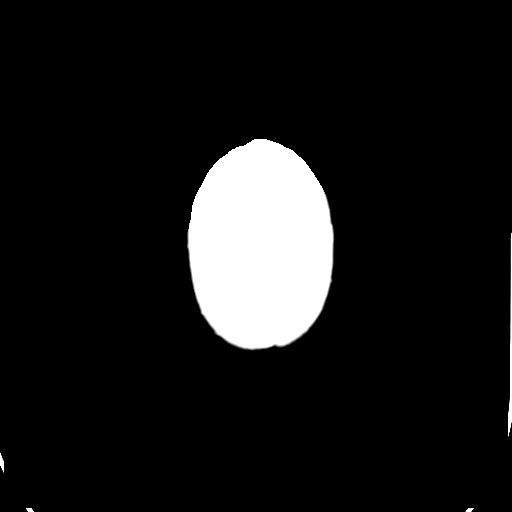

[Series 5: head 3.0 mpr cor · coronal · 0.28mm/px · 3 of 72 slices shown]
[im 27/72  brain]
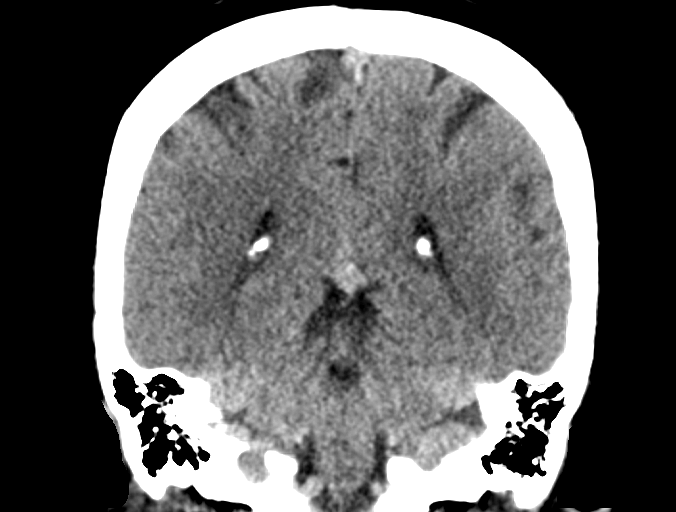
[im 33/72  brain]
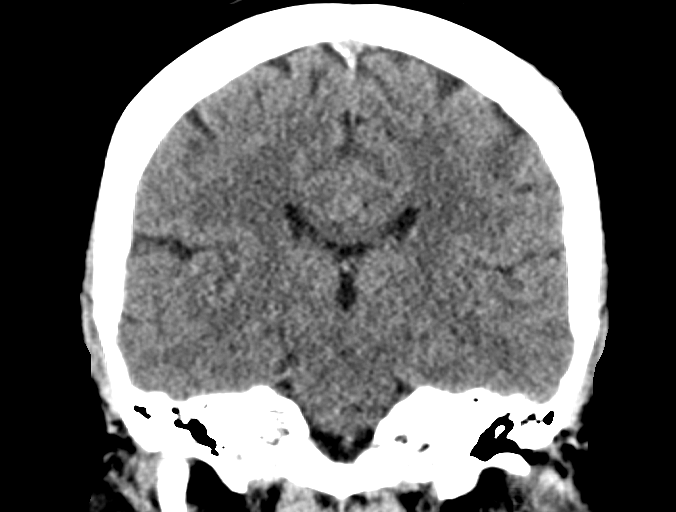
[im 39/72  brain]
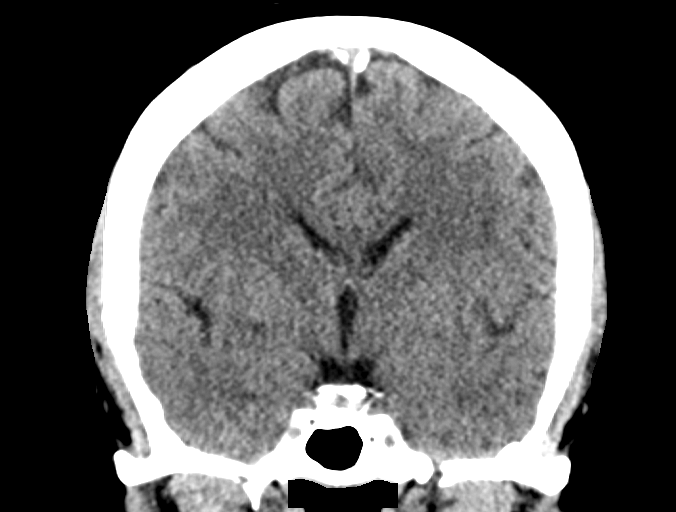

[Series 6: head 3.0 mpr sag · sagittal · 0.29mm/px · 3 of 67 slices shown]
[im 23/67  brain]
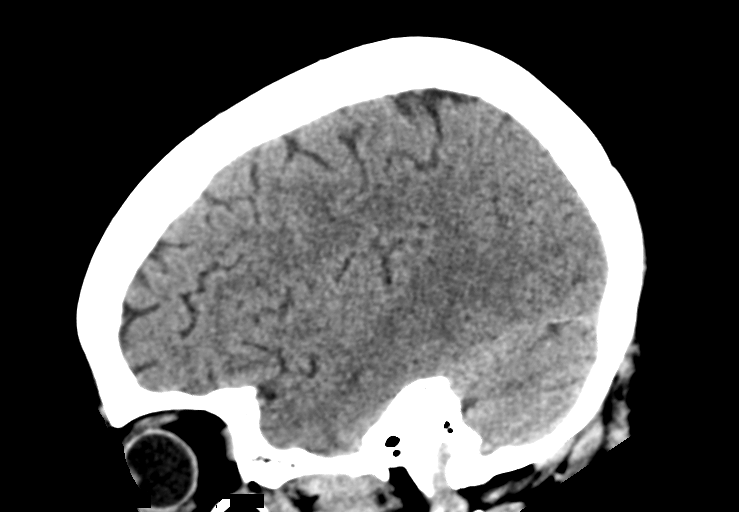
[im 34/67  brain]
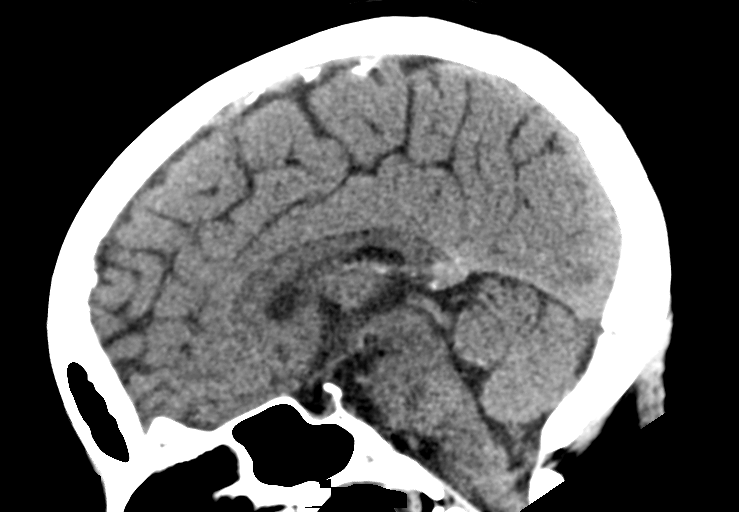
[im 45/67  brain]
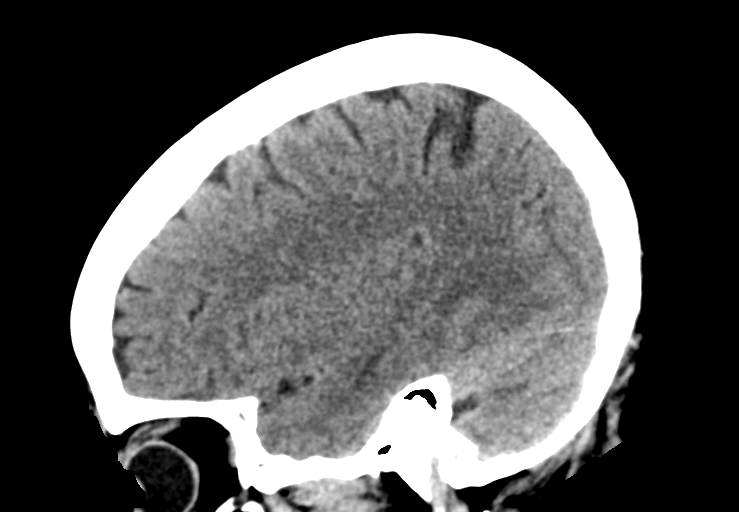

[14 of 47 positions shown; findings below may reference images not displayed]

FINDINGS: Brain: No CT evidence sites of large vascular territory or
cortically based infarction. Preservation of the gray-white
differentiation. No evidence of acute hemorrhage, hydrocephalus,
extra-axial collection, visible mass lesion or mass effect.

Vascular: No hyperdense vessel or unexpected calcification.

Skull: No calvarial fracture or suspicious osseous lesion. No scalp
swelling or hematoma.

Sinuses/Orbits: Paranasal sinuses and mastoid air cells are
predominantly clear. Included orbital structures are unremarkable.

Other: None
IMPRESSION: No acute intracranial abnormality. If there is persisting concern
for acute infarction, MRI is more sensitive and specific for early
features of ischemia.

## 2022-07-09 ENCOUNTER — Other Ambulatory Visit: Payer: Self-pay | Admitting: Student

## 2022-07-09 DIAGNOSIS — E2839 Other primary ovarian failure: Secondary | ICD-10-CM

## 2022-07-19 NOTE — Progress Notes (Deleted)
Office Visit Note  Patient: Janet Miller             Date of Birth: [birth year]           MRN: [id number]             PCP: Raymon Mutton., FNP Referring: Raymon Mutton., FNP Visit Date: 08/02/2022 Occupation: @GUAROCC @  Subjective:    History of Present Illness: Janet Miller is a 67 y.o. female with history of positive ANA and dry eyes.    Discussed use of pilocarpine Ophthalmology visit?   Activities of Daily Living:  Patient reports morning stiffness for *** {minute/hour:19697}.   Patient {ACTIONS;DENIES/REPORTS:21021675::"Denies"} nocturnal pain.  Difficulty dressing/grooming: {ACTIONS;DENIES/REPORTS:21021675::"Denies"} Difficulty climbing stairs: {ACTIONS;DENIES/REPORTS:21021675::"Denies"} Difficulty getting out of chair: {ACTIONS;DENIES/REPORTS:21021675::"Denies"} Difficulty using hands for taps, buttons, cutlery, and/or writing: {ACTIONS;DENIES/REPORTS:21021675::"Denies"}  No Rheumatology ROS completed.   PMFS History:  Patient Active Problem List   Diagnosis Date Noted   History of atherosclerosis 03/12/2022   History of hyperlipidemia 03/12/2022    Past Medical History:  Diagnosis Date   Bell's palsy     Family History  Problem Relation Age of Onset   Dementia Mother    Seizures Mother    Stroke Mother    Moyamoya disease Mother    Diabetes Sister    Stroke Brother    Diabetes Brother    Moyamoya disease Maternal Grandmother    Moyamoya disease Maternal Aunt    No past surgical history on file. Social History   Social History Narrative   Left handed    There is no immunization history on file for this patient.   Objective: Vital Signs: There were no vitals taken for this visit.   Physical Exam Vitals and nursing note reviewed.  Constitutional:      Appearance: She is well-developed.  HENT:     Head: Normocephalic and atraumatic.  Eyes:     Conjunctiva/sclera: Conjunctivae normal.  Cardiovascular:     Rate and Rhythm:  Normal rate and regular rhythm.     Heart sounds: Normal heart sounds.  Pulmonary:     Effort: Pulmonary effort is normal.     Breath sounds: Normal breath sounds.  Abdominal:     General: Bowel sounds are normal.     Palpations: Abdomen is soft.  Musculoskeletal:     Cervical back: Normal range of motion.  Lymphadenopathy:     Cervical: No cervical adenopathy.  Skin:    General: Skin is warm and dry.     Capillary Refill: Capillary refill takes less than 2 seconds.  Neurological:     Mental Status: She is alert and oriented to person, place, and time.  Psychiatric:        Behavior: Behavior normal.      Musculoskeletal Exam: ***  CDAI Exam: CDAI Score: -- Patient Global: --; Provider Global: -- Swollen: --; Tender: -- Joint Exam 08/02/2022   No joint exam has been documented for this visit   There is currently no information documented on the homunculus. Go to the Rheumatology activity and complete the homunculus joint exam.  Investigation: No additional findings.  Imaging: No results found.  Recent Labs: Lab Results  Component Value Date   WBC 5.6 08/26/2020   HGB 13.3 08/26/2020   PLT 346 08/26/2020   NA 137 08/26/2020   K 3.8 08/26/2020   CL 102 08/26/2020   CO2 28 08/26/2020   GLUCOSE 139 (H) 08/26/2020   BUN 13 08/26/2020  CREATININE 0.60 08/26/2020   BILITOT 0.6 08/26/2020   ALKPHOS 56 08/26/2020   AST 24 08/26/2020   ALT 24 08/26/2020   PROT 7.4 08/26/2020   ALBUMIN 3.7 08/26/2020   CALCIUM 9.7 08/26/2020    Speciality Comments: No specialty comments available.  Procedures:  No procedures performed Allergies: Statins   Assessment / Plan:     Visit Diagnoses: Positive ANA (antinuclear antibody)  Dry eyes  Vitamin D deficiency  H/O Bell's palsy  History of hyperlipidemia  History of atherosclerosis  Elevated hemoglobin A1c  Smoker  Orders: No orders of the defined types were placed in this encounter.  No orders of the  defined types were placed in this encounter.   Face-to-face time spent with patient was *** minutes. Greater than 50% of time was spent in counseling and coordination of care.  Follow-Up Instructions: No follow-ups on file.   Gearldine Bienenstock, PA-C  Note - This record has been created using Dragon software.  Chart creation errors have been sought, but may not always  have been located. Such creation errors do not reflect on  the standard of medical care.

## 2022-08-02 ENCOUNTER — Ambulatory Visit: Payer: Medicare Other | Admitting: Physician Assistant

## 2022-08-02 DIAGNOSIS — Z8669 Personal history of other diseases of the nervous system and sense organs: Secondary | ICD-10-CM

## 2022-08-02 DIAGNOSIS — F17200 Nicotine dependence, unspecified, uncomplicated: Secondary | ICD-10-CM

## 2022-08-02 DIAGNOSIS — E559 Vitamin D deficiency, unspecified: Secondary | ICD-10-CM

## 2022-08-02 DIAGNOSIS — R768 Other specified abnormal immunological findings in serum: Secondary | ICD-10-CM

## 2022-08-02 DIAGNOSIS — H04123 Dry eye syndrome of bilateral lacrimal glands: Secondary | ICD-10-CM

## 2022-08-02 DIAGNOSIS — Z8639 Personal history of other endocrine, nutritional and metabolic disease: Secondary | ICD-10-CM

## 2022-08-02 DIAGNOSIS — Z8679 Personal history of other diseases of the circulatory system: Secondary | ICD-10-CM

## 2022-08-02 DIAGNOSIS — R7309 Other abnormal glucose: Secondary | ICD-10-CM

## 2022-11-01 ENCOUNTER — Other Ambulatory Visit: Payer: Self-pay | Admitting: Student

## 2022-11-01 DIAGNOSIS — E2839 Other primary ovarian failure: Secondary | ICD-10-CM

## 2022-11-01 DIAGNOSIS — Z780 Asymptomatic menopausal state: Secondary | ICD-10-CM

## 2023-03-15 ENCOUNTER — Encounter: Payer: Self-pay | Admitting: Student
